# Patient Record
Sex: Male | Born: 1985 | Race: Black or African American | Hispanic: No | Marital: Single | Smoking: Never smoker
Health system: Southern US, Community
[De-identification: ages and names within clinical notes are randomized; demographics above are authoritative.]

---

## 2015-08-28 ENCOUNTER — Encounter (HOSPITAL_COMMUNITY): Payer: Self-pay | Admitting: *Deleted

## 2015-08-28 ENCOUNTER — Emergency Department (HOSPITAL_COMMUNITY): Payer: No Typology Code available for payment source

## 2015-08-28 ENCOUNTER — Emergency Department (HOSPITAL_COMMUNITY)
Admission: EM | Admit: 2015-08-28 | Discharge: 2015-08-28 | Disposition: A | Discharge status: Home / Self Care | Payer: No Typology Code available for payment source | Attending: Emergency Medicine | Admitting: Emergency Medicine

## 2015-08-28 DIAGNOSIS — Y92410 Unspecified street and highway as the place of occurrence of the external cause: Secondary | ICD-10-CM | POA: Insufficient documentation

## 2015-08-28 DIAGNOSIS — R519 Headache, unspecified: Secondary | ICD-10-CM

## 2015-08-28 DIAGNOSIS — M542 Cervicalgia: Secondary | ICD-10-CM | POA: Insufficient documentation

## 2015-08-28 DIAGNOSIS — R51 Headache: Secondary | ICD-10-CM | POA: Insufficient documentation

## 2015-08-28 DIAGNOSIS — Y999 Unspecified external cause status: Secondary | ICD-10-CM | POA: Diagnosis not present

## 2015-08-28 DIAGNOSIS — Y939 Activity, unspecified: Secondary | ICD-10-CM | POA: Diagnosis not present

## 2015-08-28 MED ORDER — ONDANSETRON 4 MG PO TBDP
4.0000 mg | ORAL_TABLET | Freq: Once | ORAL | Status: AC
  Administered 2015-08-28: 4 mg via ORAL
  Filled 2015-08-28: qty 1

## 2015-08-28 MED ORDER — NAPROXEN 250 MG PO TABS: 250.0000 mg | ORAL_TABLET | Freq: Two times a day (BID) | ORAL | Status: AC

## 2015-08-28 MED ORDER — ACETAMINOPHEN 325 MG PO TABS
650.0000 mg | ORAL_TABLET | Freq: Once | ORAL | Status: AC
  Administered 2015-08-28: 650 mg via ORAL
  Filled 2015-08-28: qty 2

## 2015-08-28 NOTE — ED Notes (Signed)
Patient is wanting to leave, does not want to wait for CT.  Will PA to bedside and discussed patient's concern and reasons for wanting to leave.  Patient has opted to be d/c and Will discussed concerns and reasons to return to ED
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
## 2015-08-28 NOTE — ED Notes (Signed)
Patient transported to X-ray 

## 2015-08-28 NOTE — ED Notes (Signed)
Patient is now having n/v and worse back pain.
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
